# Patient Record
Sex: Female | Born: 1970 | Race: Black or African American | Hispanic: No | Marital: Single
Health system: Southern US, Community
[De-identification: ages and names within clinical notes are randomized; demographics above are authoritative.]

---

## 2020-06-29 ENCOUNTER — Encounter (HOSPITAL_COMMUNITY): Payer: Self-pay | Admitting: Emergency Medicine

## 2020-06-29 ENCOUNTER — Other Ambulatory Visit: Payer: Self-pay

## 2020-06-29 DIAGNOSIS — R319 Hematuria, unspecified: Secondary | ICD-10-CM | POA: Insufficient documentation

## 2020-06-29 DIAGNOSIS — N200 Calculus of kidney: Secondary | ICD-10-CM | POA: Insufficient documentation

## 2020-06-29 DIAGNOSIS — R300 Dysuria: Secondary | ICD-10-CM | POA: Insufficient documentation

## 2020-06-29 LAB — CBC
HCT: 42.6 % (ref 36.0–46.0)
Hemoglobin: 13.4 g/dL (ref 12.0–15.0)
MCH: 28.6 pg (ref 26.0–34.0)
MCHC: 31.5 g/dL (ref 30.0–36.0)
MCV: 90.8 fL (ref 80.0–100.0)
Platelets: 306 10*3/uL (ref 150–400)
RBC: 4.69 MIL/uL (ref 3.87–5.11)
RDW: 13.8 % (ref 11.5–15.5)
WBC: 13.2 10*3/uL — ABNORMAL HIGH (ref 4.0–10.5)
nRBC: 0 % (ref 0.0–0.2)

## 2020-06-29 LAB — COMPREHENSIVE METABOLIC PANEL
ALT: 38 U/L (ref 0–44)
AST: 38 U/L (ref 15–41)
Albumin: 4.2 g/dL (ref 3.5–5.0)
Alkaline Phosphatase: 63 U/L (ref 38–126)
Anion gap: 11 (ref 5–15)
BUN: 9 mg/dL (ref 6–20)
CO2: 27 mmol/L (ref 22–32)
Calcium: 10.1 mg/dL (ref 8.9–10.3)
Chloride: 103 mmol/L (ref 98–111)
Creatinine, Ser: 0.87 mg/dL (ref 0.44–1.00)
GFR calc Af Amer: >60 mL/min (ref >60)
GFR calc non Af Amer: >60 mL/min (ref >60)
Glucose, Bld: 102 mg/dL — ABNORMAL HIGH (ref 70–99)
Potassium: 3.6 mmol/L (ref 3.5–5.1)
Sodium: 141 mmol/L (ref 135–145)
Total Bilirubin: 0.4 mg/dL (ref 0.3–1.2)
Total Protein: 7.8 g/dL (ref 6.5–8.1)

## 2020-06-29 LAB — LIPASE, BLOOD: Lipase: 39 U/L (ref 11–51)

## 2020-06-29 LAB — I-STAT BETA HCG BLOOD, ED (MC, WL, AP ONLY): I-stat hCG, quantitative: <5 m[IU]/mL (ref <5)

## 2020-06-29 MED ORDER — ONDANSETRON 4 MG PO TBDP
4.0000 mg | ORAL_TABLET | Freq: Once | ORAL | Status: AC | PRN
  Administered 2020-06-29: 4 mg via ORAL
  Filled 2020-06-29: qty 1

## 2020-06-29 MED ORDER — SODIUM CHLORIDE 0.9% FLUSH: 3.0000 mL | Freq: Once | INTRAVENOUS | Status: DC

## 2020-06-29 NOTE — ED Triage Notes (Signed)
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 Having abd pains with vomiting and dysuria with blood in urine since yesterday. Hx kidney stones.  Vitals: 170/100, 88HR, 20R,

## 2020-06-30 ENCOUNTER — Emergency Department (HOSPITAL_COMMUNITY)
Admission: EM | Admit: 2020-06-30 | Discharge: 2020-06-30 | Disposition: A | Discharge status: Home / Self Care | Payer: Self-pay | Attending: Emergency Medicine | Admitting: Emergency Medicine

## 2020-06-30 ENCOUNTER — Emergency Department (HOSPITAL_COMMUNITY): Payer: Self-pay

## 2020-06-30 DIAGNOSIS — N200 Calculus of kidney: Secondary | ICD-10-CM

## 2020-06-30 DIAGNOSIS — R319 Hematuria, unspecified: Secondary | ICD-10-CM

## 2020-06-30 LAB — URINALYSIS, ROUTINE W REFLEX MICROSCOPIC
Bacteria, UA: NONE SEEN
Bilirubin Urine: NEGATIVE
Glucose, UA: NEGATIVE mg/dL
Ketones, ur: NEGATIVE mg/dL
Nitrite: NEGATIVE
Protein, ur: 30 mg/dL — AB
RBC / HPF: >50 RBC/hpf — ABNORMAL HIGH (ref 0–5)
Specific Gravity, Urine: 1.024 (ref 1.005–1.030)
pH: 7 (ref 5.0–8.0)

## 2020-06-30 MED ORDER — OXYCODONE-ACETAMINOPHEN 5-325 MG PO TABS: 1.0000 | ORAL_TABLET | ORAL | 0 refills | Status: AC | PRN

## 2020-06-30 MED ORDER — ONDANSETRON 4 MG PO TBDP: 4.0000 mg | ORAL_TABLET | Freq: Three times a day (TID) | ORAL | 0 refills | Status: AC | PRN

## 2020-06-30 MED ORDER — CEPHALEXIN 500 MG PO CAPS: 500.0000 mg | ORAL_CAPSULE | Freq: Three times a day (TID) | ORAL | 0 refills | Status: AC

## 2020-06-30 MED ORDER — OXYCODONE-ACETAMINOPHEN 5-325 MG PO TABS
2.0000 | ORAL_TABLET | Freq: Once | ORAL | Status: AC
  Administered 2020-06-30: 2 via ORAL
  Filled 2020-06-30: qty 2

## 2020-06-30 NOTE — Social Work (Signed)
[facility] CSW covering remotely, received call in regards to Pt. Pt was discharged from [facility] early in the morning on 7/27 but was unable to find any transportation back hometo [place], [place] upon discharge.  This Clinical research associate was consulted at 7pm. [facility] Team worked with Pt to get Pt transportation as far as [place] so that her family could come and pick her up.  CSW utilized [employer] transportation for ride to [place].  Shella Spearing MSW LCSWA Transitions of Care  Clinical Social Worker  [facility] Emergency Departments  [phone]

## 2020-06-30 NOTE — ED Provider Notes (Signed)
[facility]-EMERGENCY DEPT Provider Note   CSN: [id number] Arrival date & time: 06/29/20  1543     History Chief Complaint  Patient presents with  . Abdominal Pain  . Hematuria  . Dysuria    Shannon Humphrey is a 49 y.o. female.  The history is provided by the patient and medical records.  Abdominal Pain Associated symptoms: dysuria and nausea   Dysuria Associated symptoms: abdominal pain and nausea     48 y.o. F presenting to the ED with left flank pain.  States pain began yesterday, has gotten worse throughout the evening.  Pain begins in left back with radiation to left groin.  States she has had painful urination along with difficulty urinating, has seen some blood in urine as well.  Has had nausea without vomiting.  No fever/chills.  She states hx of kidney stones many years ago requiring lithotripsy x2.  History reviewed. No pertinent past medical history.  There are no problems to display for this patient.   History reviewed. No pertinent surgical history.   OB History   No obstetric history on file.     No family history on file.  Social History   Tobacco Use  . Smoking status: Not on file  Substance Use Topics  . Alcohol use: Not on file  . Drug use: Not on file    Home Medications Prior to Admission medications   Not on File    Allergies    Patient has no known allergies.  Review of Systems   Review of Systems  Gastrointestinal: Positive for abdominal pain and nausea.  Genitourinary: Positive for difficulty urinating and dysuria.  All other systems reviewed and are negative.   Physical Exam Updated Vital Signs BP (!) 164/86 (BP Location: Left Arm)   Pulse 85   Temp 98.8 F (37.1 C) (Oral)   Resp 20   SpO2 98%   Physical Exam Vitals and nursing note reviewed.  Constitutional:      Appearance: She is well-developed.  HENT:     Head: Normocephalic and atraumatic.  Eyes:     Conjunctiva/sclera: Conjunctivae  normal.     Pupils: Pupils are equal, round, and reactive to light.  Cardiovascular:     Rate and Rhythm: Normal rate and regular rhythm.     Heart sounds: Normal heart sounds.  Pulmonary:     Effort: Pulmonary effort is normal.     Breath sounds: Normal breath sounds.  Abdominal:     General: Bowel sounds are normal.     Palpations: Abdomen is soft.     Tenderness: There is abdominal tenderness. There is no rebound.    Musculoskeletal:        General: Normal range of motion.     Cervical back: Normal range of motion.  Skin:    General: Skin is warm and dry.  Neurological:     Mental Status: She is alert and oriented to person, place, and time.     ED Results / Procedures / Treatments   Labs (all labs ordered are listed, but only abnormal results are displayed) Labs Reviewed  COMPREHENSIVE METABOLIC PANEL - Abnormal; Notable for the following components:      Result Value   Glucose, Bld 102 (*)    All other components within normal limits  CBC - Abnormal; Notable for the following components:   WBC 13.2 (*)    All other components within normal limits  LIPASE, BLOOD  URINALYSIS, ROUTINE W REFLEX MICROSCOPIC  I-STAT BETA HCG BLOOD, ED ([facility], [facility], [facility] ONLY)    EKG None  Radiology CT Renal Stone Study  Result Date: 06/30/2020 CLINICAL DATA:  Left-sided flank pain gross hematuria EXAM: CT ABDOMEN AND PELVIS WITHOUT CONTRAST TECHNIQUE: Multidetector CT imaging of the abdomen and pelvis was performed following the standard protocol without IV contrast. COMPARISON:  None. FINDINGS: Lower chest: The visualized heart size within normal limits. No pericardial fluid/thickening. No hiatal hernia. The visualized portions of the lungs are clear. Hepatobiliary: Although limited due to the lack of intravenous contrast, normal in appearance without gross focal abnormality. No evidence of calcified gallstones or biliary ductal dilatation. Pancreas:  Unremarkable.  No surrounding inflammatory  changes. Spleen: Normal in size. Although limited due to the lack of intravenous contrast, normal in appearance. Adrenals/Urinary Tract: Both adrenal glands appear normal. 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 There is significant left-sided perinephric and proximal periureteral stranding seen. There is a punctate 3 mm calculus in the upper pole of the left kidney and a punctate 2 mm calculus in the lower pole. There is also a punctate 2 mm calculus in the lower pole the right kidney. No right-sided hydronephrosis is seen. Stomach/Bowel: The stomach, small bowel, and colon are normal in appearance. No inflammatory changes or obstructive findings. appendix is normal. Vascular/Lymphatic: There are no enlarged abdominal or pelvic lymph nodes. Scattered mild atherosclerosis seen within the distal aorta. Reproductive: The uterus and adnexa are unremarkable. Other: No evidence of abdominal wall mass or hernia. Musculoskeletal: No acute or significant osseous findings. IMPRESSION: Moderate left hydronephrosis with a 9 mm calculus in the posterior left bladder, likely recently passed stone. Significant left-sided perinephric and periureteral stranding changes which could be due to concomitant pyelonephritis. Punctate nonobstructing bilateral renal calculi. Aortic Atherosclerosis (ICD10-I70.0). Electronically Signed   By: Jonna Clark M.D.   On: 06/30/2020 03:23    Procedures Procedures (including critical care time)  Medications Ordered in ED Medications  sodium chloride flush (NS) 0.9 % injection 3 mL (0 mLs Intravenous Hold 06/30/20 0214)  ondansetron (ZOFRAN-ODT) disintegrating tablet 4 mg (4 mg Oral Given 06/29/20 1557)  oxyCODONE-acetaminophen (PERCOCET/ROXICET) 5-325 MG per tablet 2 tablet (2 tablets Oral Given 06/30/20 0210)    ED Course  I  have reviewed the triage vital signs and the nursing notes.  Pertinent labs & imaging results that were available during my care of the patient were reviewed by me and considered in my medical decision making (see chart for details).    MDM Rules/Calculators/A&P  49 y.o. F here with left flank pain since yesterday, worsening throughout evening with reported dysuria, hematuria, difficulty urinating.  She is afebrile, non-toxic.  Labs as above-- mild leukocytosis, normal renal function.  UA with blood and small leuks but no bacteria.  Suspect renal stone.  CT ordered, given pain meds.  CT with left hydro with 51mm stone in left posterior bladder.  There is some left sided peripheric stranding, question concurrent pyelo.  Patient without significantly infected urine, fever, and is very comfortable after PO percocet with stable VS.  May be early pyelo vs inflammatory from large stone.  No signs of sepsis currently.  Feel she is stable for discharge with OP management.  Stone should pass in the next 24-48 hours.  Plan to d/c home with keflex given CT findings.  Given urology follow-up.  Return here for any new/acute changes.  Final Clinical Impression(s) / ED Diagnoses Final diagnoses:  Kidney stone  Hematuria, unspecified type    Rx / DC Orders ED Discharge Orders         Ordered    oxyCODONE-acetaminophen (PERCOCET) 5-325 MG tablet  Every 4 hours PRN     Discontinue  Reprint     06/30/20 0342    ondansetron (ZOFRAN ODT) 4 MG disintegrating tablet  Every 8 hours PRN     Discontinue  Reprint     06/30/20 0342    cephALEXin (KEFLEX) 500 MG capsule  3 times daily     Discontinue  Reprint     06/30/20 0342           Garlon Hatchet, PA-C 06/30/20 [id number]    Devoria Albe, MD 06/30/20 [phone]

## 2020-06-30 NOTE — Discharge Instructions (Signed)
Kidney stone has already dropped into the bladder, likely to pass in the next 24-48 hours. Take the prescribed medication as directed.  Make sure to drink lots of water to stay hydrated. Follow-up with urology-- can call for appt. Return to the ED for new or worsening symptoms.

## 2022-02-09 IMAGING — CT CT RENAL STONE PROTOCOL
2 of 4 series · 16 of 46 positions shown, 18 images · non-contrast
Comparison: None.

CLINICAL DATA: Left-sided flank pain gross hematuria

EXAM:
CT ABDOMEN AND PELVIS WITHOUT CONTRAST
TECHNIQUE: Multidetector CT imaging of the abdomen and pelvis was performed
following the standard protocol without IV contrast.

[Series 2: axial st · axial · 0.68mm/px · z∈[-604,-188]mm · 13 of 95 slices shown, 15 images]
[im 6/95  soft-tissue]
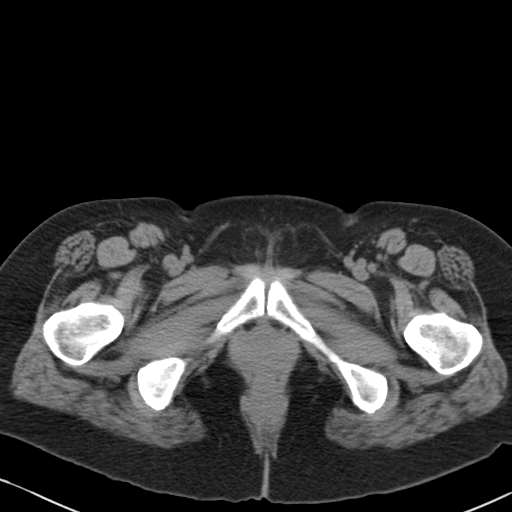
[im 6/95  bone]
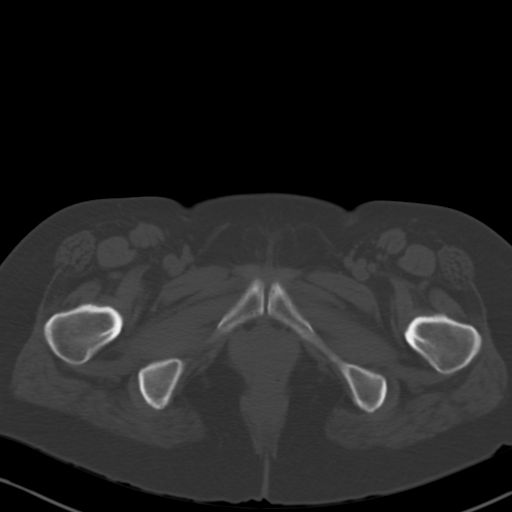
[im 11/95  soft-tissue]
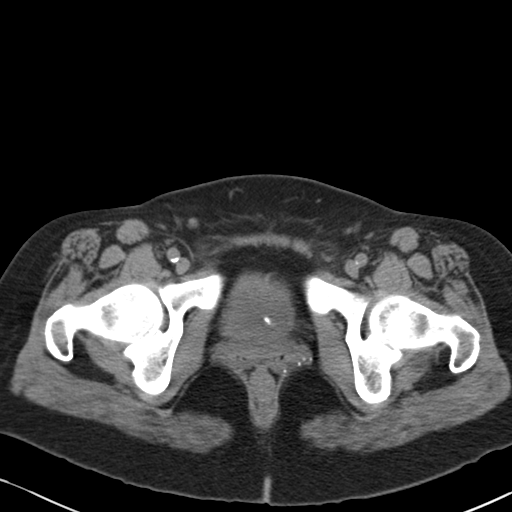
[im 21/95  soft-tissue]
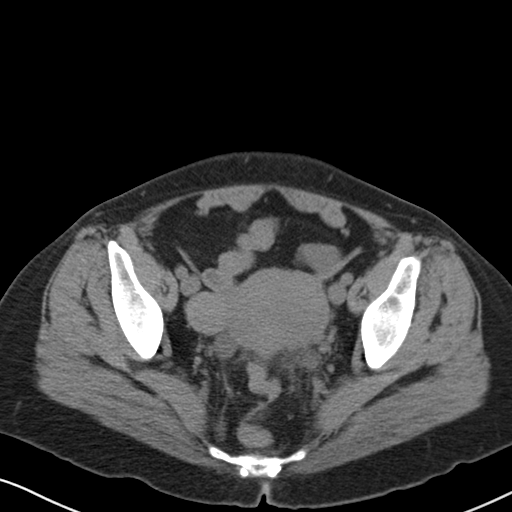
[im 27/95  soft-tissue]
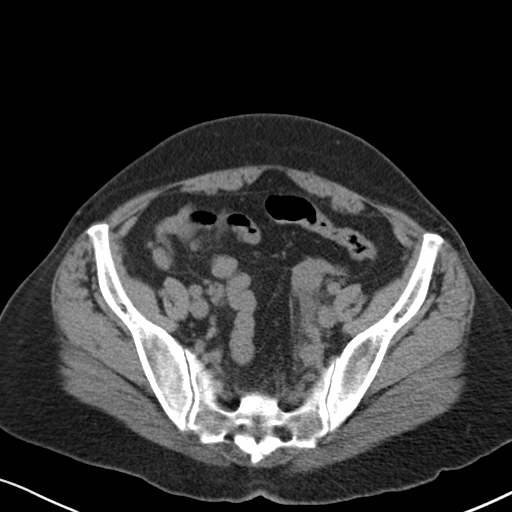
[im 32/95  soft-tissue]
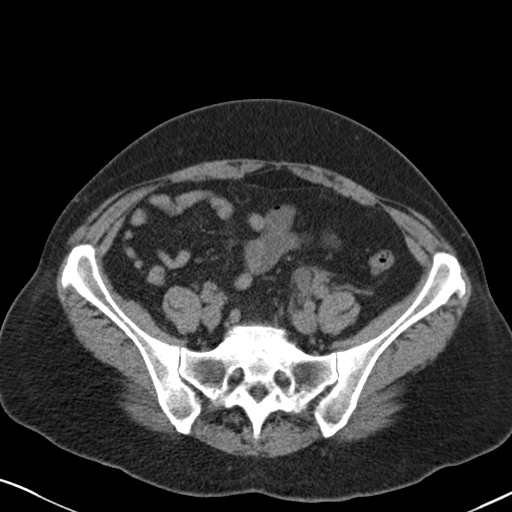
[im 42/95  soft-tissue]
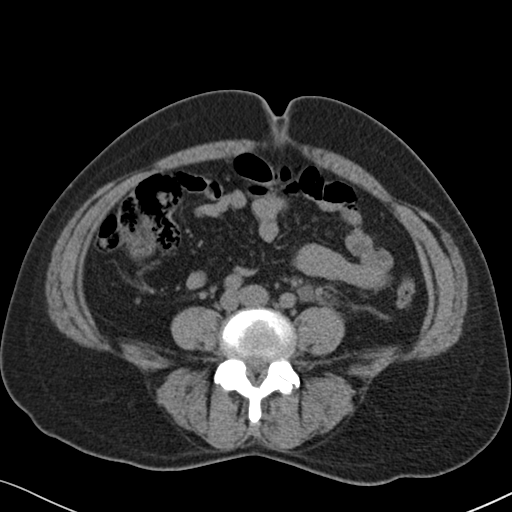
[im 48/95  soft-tissue]
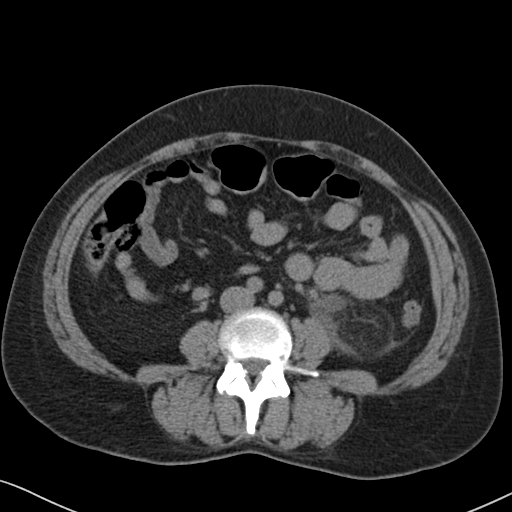
[im 53/95  soft-tissue]
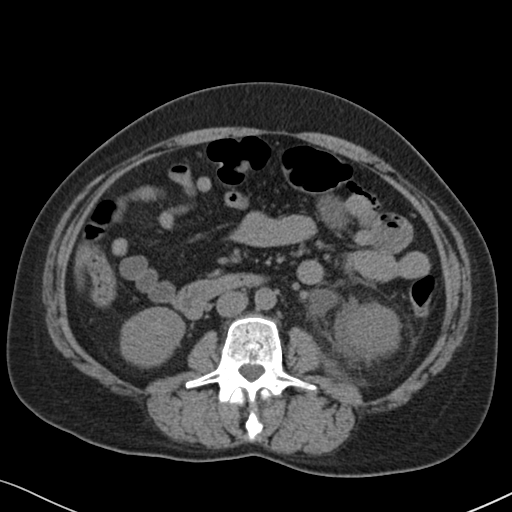
[im 63/95  soft-tissue]
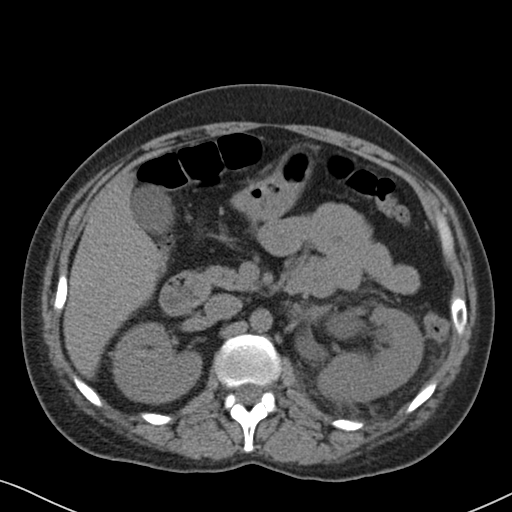
[im 63/95  bone]
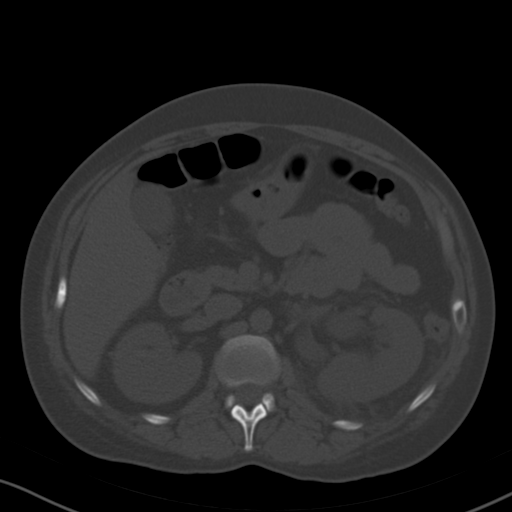
[im 68/95  soft-tissue]
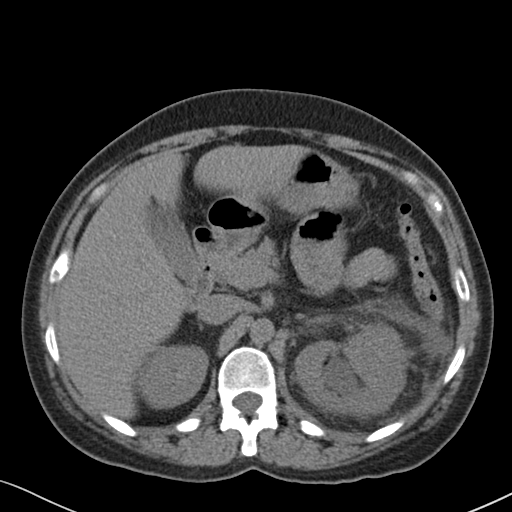
[im 74/95  soft-tissue]
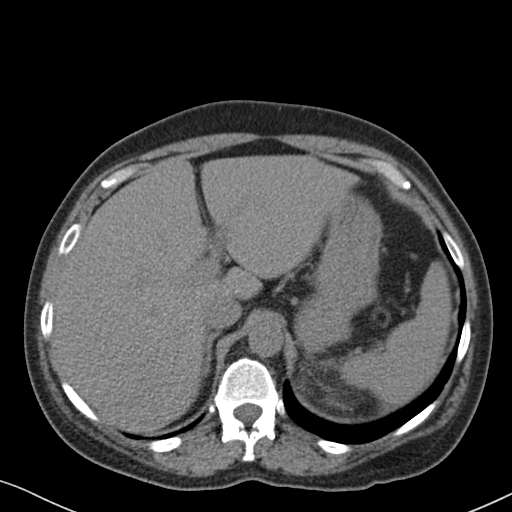
[im 84/95  soft-tissue]
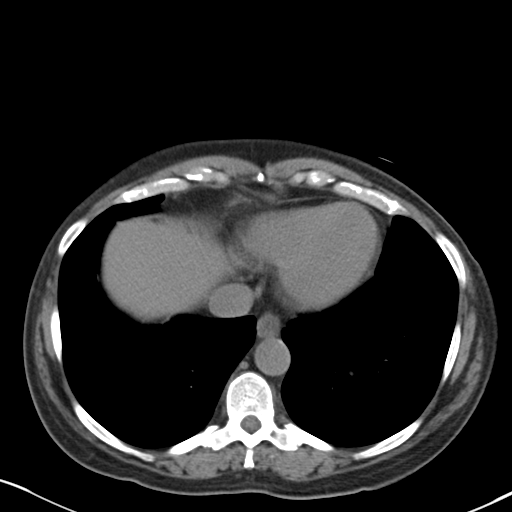
[im 89/95  soft-tissue]
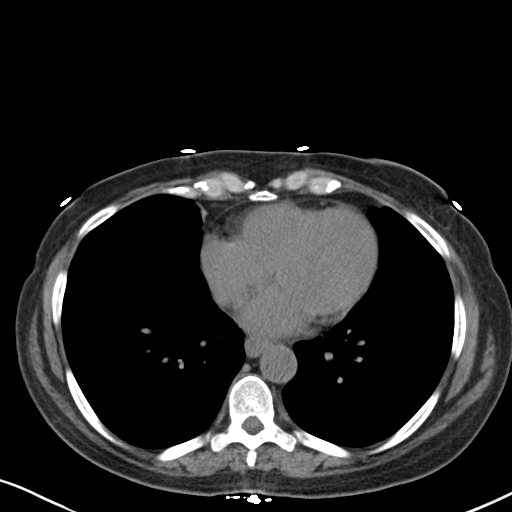

[Series 4: coronal · coronal · 0.79mm/px · 3 of 140 slices shown]
[im 47/140  soft-tissue]
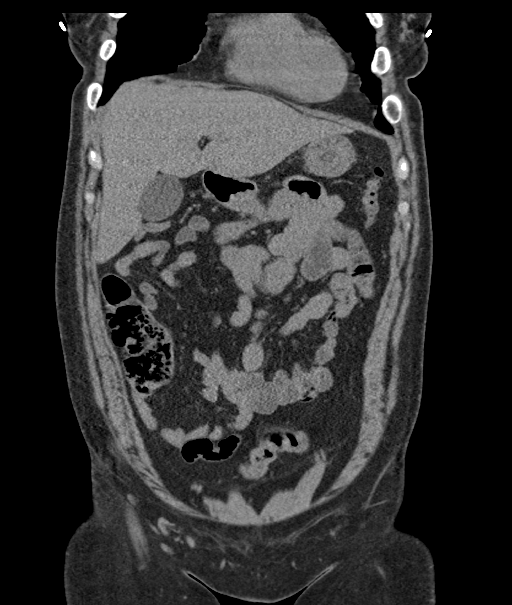
[im 62/140  soft-tissue]
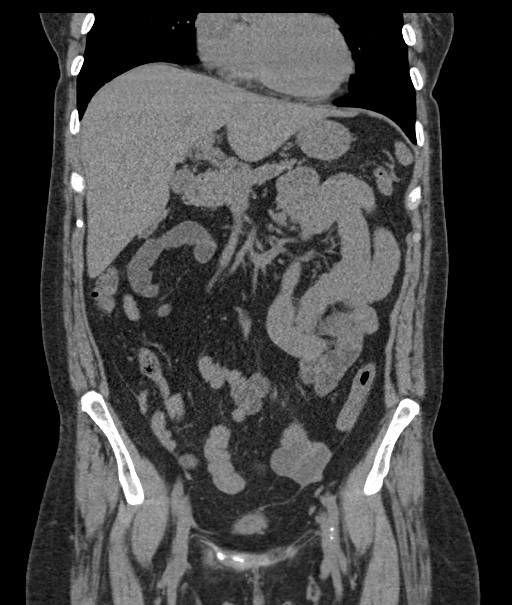
[im 78/140  soft-tissue]
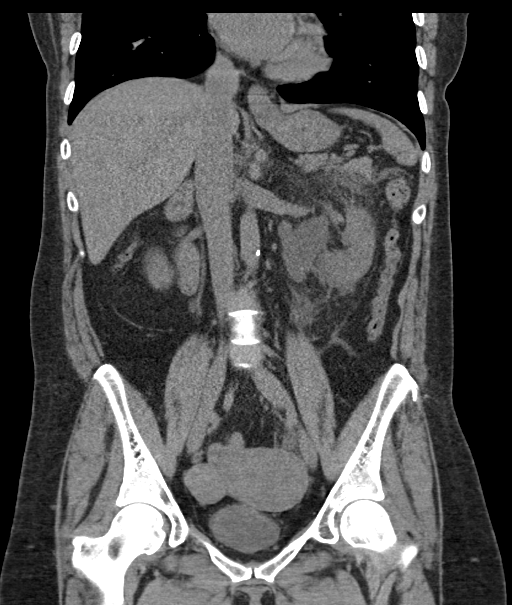

[16 of 46 positions shown; findings below may reference images not displayed]

FINDINGS: Lower chest: The visualized heart size within normal limits. No
pericardial fluid/thickening.

No hiatal hernia.

The visualized portions of the lungs are clear.

Hepatobiliary: Although limited due to the lack of intravenous
contrast, normal in appearance without gross focal abnormality. No
evidence of calcified gallstones or biliary ductal dilatation.

Pancreas:  Unremarkable.  No surrounding inflammatory changes.

Spleen: Normal in size. Although limited due to the lack of
intravenous contrast, normal in appearance.

Adrenals/Urinary Tract: Both adrenal glands appear normal. There is
a partially duplicated left collecting system with moderate
pelvicaliectasis and ureterectasis down to the level of the UVJ
where there is a 9 mm linear calcification the posterior left
bladder. There is significant left-sided perinephric and proximal
periureteral stranding seen. There is a punctate 3 mm calculus in
the upper pole of the left kidney and a punctate 2 mm calculus in
the lower pole. There is also a punctate 2 mm calculus in the lower
pole the right kidney. No right-sided hydronephrosis is seen.

Stomach/Bowel: The stomach, small bowel, and colon are normal in
appearance. No inflammatory changes or obstructive findings.
appendix is normal.

Vascular/Lymphatic: There are no enlarged abdominal or pelvic lymph
nodes. Scattered mild atherosclerosis seen within the distal aorta.

Reproductive: The uterus and adnexa are unremarkable.

Other: No evidence of abdominal wall mass or hernia.

Musculoskeletal: No acute or significant osseous findings.
IMPRESSION: Moderate left hydronephrosis with a 9 mm calculus in the posterior
left bladder, likely recently passed stone.

Significant left-sided perinephric and periureteral stranding
changes which could be due to concomitant pyelonephritis.

Punctate nonobstructing bilateral renal calculi.

Aortic Atherosclerosis ([id number]-CXZ.Z).
